# Patient Record
Sex: Male | Born: 1990 | Race: Black or African American | Hispanic: No | Marital: Single | Smoking: Never smoker
Health system: Southern US, Community
[De-identification: ages and names within clinical notes are randomized; demographics above are authoritative.]

## PROBLEM LIST (undated history)

## (undated) DIAGNOSIS — J302 Other seasonal allergic rhinitis: Secondary | ICD-10-CM

---
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
## 2011-05-01 ENCOUNTER — Encounter (HOSPITAL_COMMUNITY): Payer: Self-pay

## 2011-05-01 ENCOUNTER — Emergency Department (INDEPENDENT_AMBULATORY_CARE_PROVIDER_SITE_OTHER)
Admission: EM | Admit: 2011-05-01 | Discharge: 2011-05-01 | Disposition: A | Discharge status: Home / Self Care | Payer: Medicaid Other | Source: Home / Self Care | Attending: Emergency Medicine | Admitting: Emergency Medicine

## 2011-05-01 DIAGNOSIS — B085 Enteroviral vesicular pharyngitis: Secondary | ICD-10-CM

## 2011-05-01 HISTORY — DX: Other seasonal allergic rhinitis: J30.2

## 2011-05-01 LAB — POCT RAPID STREP A: Streptococcus, Group A Screen (Direct): NEGATIVE

## 2011-05-01 MED ORDER — SUCRALFATE 1 GM/10ML PO SUSP: 1.0000 g | Freq: Four times a day (QID) | ORAL | Status: AC

## 2011-05-01 NOTE — ED Provider Notes (Signed)
Chief Complaint  Patient presents with  . Sore Throat    History of Present Illness:  Logan Rivera is a 21 year old male who has had a three-day history of sore throat, muscle aches, chills, dry cough, nausea, and upper abdominal pain. He denies any headache, nasal congestion, rhinorrhea, swollen glands, vomiting, or diarrhea. He has not been exposed to strep or to mono.  Review of Systems:  Other than noted above, the patient denies any of the following symptoms. Systemic:  No fever, chills, sweats, fatigue, myalgias, headache, or anorexia. Eye:  No redness, pain or drainage. ENT:  No earache, nasal congestion, rhinorrhea, sinus pressure, or sore throat. Lungs:  No cough, sputum production, wheezing, shortness of breath. Or chest pain. GI:  No nausea, vomiting, abdominal pain or diarrhea. Skin:  No rash or itching.  PMFSH:  Past medical history, family history, social history, meds, and allergies were reviewed.  Physical Exam:   Vital signs:  BP 113/63  Pulse 72  Temp(Src) 99 F (37.2 C) (Oral)  Resp 14  SpO2 100% General:  Alert, in no distress. Eye:  No conjunctival injection or drainage. ENT:  TMs and canals were normal, without erythema or inflammation.  Nasal mucosa was clear and uncongested, without drainage.  Mucous membranes were moist.  Exam of the pharynx reveals a shallow ulceration on his left soft palate. The tonsils were not enlarged and there was no exudate.  There were no oral ulcerations or lesions. Neck:  Supple, no adenopathy, tenderness or mass. Lungs:  No respiratory distress.  Lungs were clear to auscultation, without wheezes, rales or rhonchi.  Breath sounds were clear and equal bilaterally. Heart:  Regular rhythm, without gallops, murmers or rubs. Skin:  Clear, warm, and dry, without rash or lesions.  Labs:   Results for orders placed during the hospital encounter of 05/01/11  POCT RAPID STREP A ([facility] ONLY)      Component Value Range   Streptococcus,  Group A Screen (Direct) NEGATIVE  NEGATIVE      Radiology:  No results found.  Medications given in UCC:  None  Assessment:   Diagnoses that have been ruled out:  None  Diagnoses that are still under consideration:  None  Final diagnoses:  Herpangina      Plan:   1.  The following meds were prescribed:   New Prescriptions   SUCRALFATE (CARAFATE) 1 GM/10ML SUSPENSION    Take 10 mLs (1 g total) by mouth 4 (four) times daily.   2.  The patient was instructed in symptomatic care and handouts were given. 3.  The patient was told to return if becoming worse in any way, if no better in 3 or 4 days, and given some red flag symptoms that would indicate earlier return.   Roque Lias, MD 05/01/11 [phone]

## 2011-05-01 NOTE — ED Notes (Signed)
Provided ice chips, gingerale, crackers

## 2011-05-01 NOTE — ED Notes (Signed)
C/o sore throat and chills. Pt states unsure when sx started.  Mother states this am he was c/o sore throat, neck pain and chills.  Denies cold sx

## 2013-06-12 ENCOUNTER — Emergency Department (HOSPITAL_COMMUNITY): Payer: Worker's Compensation

## 2013-06-12 ENCOUNTER — Encounter (HOSPITAL_COMMUNITY): Payer: Self-pay | Admitting: Emergency Medicine

## 2013-06-12 ENCOUNTER — Emergency Department (HOSPITAL_COMMUNITY)
Admission: EM | Admit: 2013-06-12 | Discharge: 2013-06-12 | Disposition: A | Discharge status: Home / Self Care | Payer: Worker's Compensation | Attending: Emergency Medicine | Admitting: Emergency Medicine

## 2013-06-12 DIAGNOSIS — G8929 Other chronic pain: Secondary | ICD-10-CM | POA: Insufficient documentation

## 2013-06-12 DIAGNOSIS — Z8709 Personal history of other diseases of the respiratory system: Secondary | ICD-10-CM | POA: Insufficient documentation

## 2013-06-12 DIAGNOSIS — M549 Dorsalgia, unspecified: Secondary | ICD-10-CM

## 2013-06-12 DIAGNOSIS — M546 Pain in thoracic spine: Secondary | ICD-10-CM | POA: Insufficient documentation

## 2013-06-12 DIAGNOSIS — F79 Unspecified intellectual disabilities: Secondary | ICD-10-CM | POA: Insufficient documentation

## 2013-06-12 DIAGNOSIS — IMO0002 Reserved for concepts with insufficient information to code with codable children: Secondary | ICD-10-CM | POA: Insufficient documentation

## 2013-06-12 LAB — URINALYSIS, ROUTINE W REFLEX MICROSCOPIC
Bilirubin Urine: NEGATIVE
Glucose, UA: NEGATIVE mg/dL
Hgb urine dipstick: NEGATIVE
Ketones, ur: 15 mg/dL — AB
Leukocytes, UA: NEGATIVE
NITRITE: NEGATIVE
PH: 7.5 (ref 5.0–8.0)
Protein, ur: NEGATIVE mg/dL
Specific Gravity, Urine: 1.023 (ref 1.005–1.030)
Urobilinogen, UA: 1 mg/dL (ref 0.0–1.0)

## 2013-06-12 MED ORDER — KETOROLAC TROMETHAMINE 15 MG/ML IJ SOLN
30.0000 mg | Freq: Once | INTRAMUSCULAR | Status: AC
  Administered 2013-06-12: 30 mg via INTRAMUSCULAR
  Filled 2013-06-12: qty 2

## 2013-06-12 MED ORDER — NAPROXEN 500 MG PO TABS: 500.0000 mg | ORAL_TABLET | Freq: Two times a day (BID) | ORAL | Status: AC

## 2013-06-12 MED ORDER — CYCLOBENZAPRINE HCL 5 MG PO TABS: 5.0000 mg | ORAL_TABLET | Freq: Three times a day (TID) | ORAL | Status: AC | PRN

## 2013-06-12 NOTE — ED Notes (Signed)
Per pt, states he was lifting something at work-started having lower back pain radiating to left side-states discomfort with inhalation and exhalation-no s/s's of respiratory distress

## 2013-06-12 NOTE — Progress Notes (Signed)
[facility] Stacy, provided pt with a list of primary care resources to help patient establish primary care.  °

## 2013-06-12 NOTE — ED Provider Notes (Signed)
CSN: [id number]     Arrival date & time 06/12/13  1435 History  This chart was scribed for non-physician practitioner, Coral CeoJessica Julissa Browning, PA-C working with Celene KrasJon R Knapp, MD by Greggory StallionKayla Andersen, ED scribe. This patient was seen in room WTR9/WTR9 and the patient's care was started at 3:46 PM.    Chief Complaint  Patient presents with  . Back Pain   The history is provided by the patient. No language interpreter was used.   HPI Comments: Logan Rivera is a 23 y.o. male with a PMH of asthma and seasonal allergies who presents to the Emergency Department complaining of worsening left upper back pain that started 2 days ago after lifting a 30 pound object. History provided by mother and patient. Patient is a poor historian. 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 Denies fall/blunt trauma. Patient has a hx of chronic lower back pain and muscle spasms. He states his pain today is similar to his intermittent spasms in the past but is in a different location. Pain is worsened with exhaling and movement. He has used a back brace with no relief. Denies fever, difficulty breathing, cough, SOB, chest pain, abdominal pain, bowel or bladder incontinence, dysuria. No hx of DVT/PE/clotting, hx of cancer, or recent surgery/immobilization/travel.     Past Medical History  Diagnosis Date  . Seasonal allergies    History reviewed. No pertinent past surgical history. No family history on file. History  Substance Use Topics  . Smoking status: Never Smoker   . Smokeless tobacco: Not on file  . Alcohol Use: No    Review of Systems  Constitutional: Negative for fever.  Respiratory: Negative for cough and shortness of breath.   Cardiovascular: Negative for chest pain.  Gastrointestinal: Negative for abdominal pain.  Genitourinary: Negative for dysuria.       Negative for bowel or bladder incontinence.   Musculoskeletal: Positive for back pain.  All other systems reviewed and are negative.   Allergies  Review of patient's allergies indicates no known allergies.  Home Medications   Current Outpatient Rx  Name  Route  Sig  Dispense  Refill  . Cetirizine HCl (ZYRTEC PO)   Oral   Take by mouth.         . fluticasone (VERAMYST) 27.5 MCG/SPRAY nasal spray   Nasal   Place 2 sprays into the nose daily.         . mometasone (ASMANEX) 220 MCG/INH inhaler   Inhalation   Inhale 2 puffs into the lungs daily.          BP 138/79  Pulse 63  Temp(Src) 98.3 F (36.8 C) (Oral)  Resp 16  SpO2 100%  Filed Vitals:   06/12/13 1445 06/12/13 1720  BP: 138/79 124/60  Pulse: 63 60  Temp: 98.3 F (36.8 C)   TempSrc: Oral   Resp: 16 16  SpO2: 100% 100%    Physical Exam  Nursing note and vitals reviewed. Constitutional: He is oriented to person, place, and time. He appears well-developed and well-nourished. No distress.  HENT:  Head: Normocephalic and atraumatic.  Right Ear: External ear normal.  Left Ear: External ear normal.  Mouth/Throat: Oropharynx is clear and moist.  Eyes: Conjunctivae and EOM are normal. Right eye exhibits no discharge. Left eye exhibits no discharge.  Neck: Normal range of motion. Neck supple. No tracheal deviation present.  No cervical spinal or  paraspinal tenderness to palpation throughout.  No limitations with neck ROM.    Cardiovascular: Normal rate, regular rhythm, normal heart sounds and intact distal pulses.  Exam reveals no gallop and no friction rub.   No murmur heard. Dorsalis pedis pulses present and equal bilaterally  Pulmonary/Chest: Effort normal and breath sounds normal. No respiratory distress. He has no wheezes. He has no rales.   He exhibits tenderness.    Tenderness to palpation to the left lower lateral and posterior ribs diffusely. No underlying crepitus or palpable step-off. No edema, ecchymosis, erythema, rash, or lacerations.    Abdominal: Soft. He exhibits no distension. There is no tenderness.  Left mild CVA tenderness  Musculoskeletal: Normal range of motion. He exhibits no edema and no tenderness.  No tenderness to palpation to the thoracic or lumbar spinous processes throughout.  No tenderness to palpation to the paraspinal muscles throughout. Strength 5/5 in the upper and lower extremities bilaterally. Patient able to ambulate without difficulty or ataxia.    Neurological: He is alert and oriented to person, place, and time.  Patellar reflexes intact bilaterally  Skin: Skin is warm and dry. He is not diaphoretic.  Psychiatric: He has a normal mood and affect. His behavior is normal.    ED Course  Procedures (including critical care time)  DIAGNOSTIC STUDIES: Oxygen Saturation is 100% on RA, normal by my interpretation.    COORDINATION OF CARE: 3:51 PM-Discussed treatment plan which includes xray and Toradol with pt at bedside and pt agreed to plan. Family  Labs Review Labs Reviewed - No data to display Imaging Review No results found.  EKG Interpretation  None  Results for orders placed during the hospital encounter of 06/12/13  URINALYSIS, ROUTINE W REFLEX MICROSCOPIC      Result Value Ref Range   Color, Urine YELLOW  YELLOW   APPearance CLEAR  CLEAR   Specific Gravity, Urine 1.023  1.005 - 1.030   pH 7.5  5.0 - 8.0   Glucose, UA NEGATIVE  NEGATIVE mg/dL   Hgb urine dipstick NEGATIVE  NEGATIVE   Bilirubin Urine NEGATIVE  NEGATIVE   Ketones, ur 15 (*) NEGATIVE mg/dL   Protein, ur NEGATIVE  NEGATIVE mg/dL   Urobilinogen, UA 1.0  0.0 - 1.0 mg/dL   Nitrite NEGATIVE  NEGATIVE   Leukocytes, UA NEGATIVE  NEGATIVE     DG Chest 2 View (Final result)  Result time: 06/12/13 16:33:32    Final result by Rad Results In Interface (06/12/13 16:33:32)    Narrative:   CLINICAL DATA: Upper back pain with dyspnea, history of asthma  EXAM: CHEST 2 VIEW  COMPARISON: DG CHEST 2 VIEW dated  12/03/2008  FINDINGS: The lungs are mildly hyperinflated without hemidiaphragm flattening. There is no focal infiltrate. Stable nodules measuring up to 3 mm in diameter are present bilaterally consistent with previous granulomatous infection. The cardiopericardial silhouette is normal in size. The pulmonary vascularity is not engorged. The mediastinum is normal in width. There is no pleural effusion or pneumothorax or pneumomediastinum. The observed portions of the bony thorax exhibit no acute abnormalities.  IMPRESSION: There is mild hyperinflation which may reflect the patient's reactive airway disease. There is no evidence of acute pneumonia nor CHF.   Electronically Signed By: David [place] On: 06/12/2013 16:33          MDM   Logan Rivera is a 23 y.o. male is a 23 y.o. male with a PMH of asthma and seasonal allergies who presents to the Emergency  Department complaining of worsening upper back pain that started 2 days ago after lifting a 30 pound object.   Rechecks  5:00 PM = Patient states he feels much better. No distress.    Etiology of back pain likely due to a muscle strain. X-rays negative for and acute cardiopulmonary process or fx. Patient's x-rays showed RAD and nodules which was communicated to the patient. No complaints of SOB, dyspnea, or other respiratory concerns. No evidence of a UTI on UA. Vital signs stable with no hypoxia or tachypnea. Patient neurovascularly intact with no neurological deficits. Patient instructed to follow-up with PCP. Return precautions, discharge instructions, and follow-up was discussed with the patient before discharge.       Discharge Medication List as of 06/12/2013  5:07 PM    START taking these medications   Details  cyclobenzaprine (FLEXERIL) 5 MG tablet Take 1 tablet (5 mg total) by mouth 3 (three) times daily as needed for muscle spasms., Starting 06/12/2013, Until Discontinued, Print    naproxen (NAPROSYN) 500 MG tablet  Take 1 tablet (500 mg total) by mouth 2 (two) times daily with a meal., Starting 06/12/2013, Until Discontinued, Print         Final impressions: 1. Back pain       Thomasenia Sales   I personally performed the services described in this documentation, which was scribed in my presence. The recorded information has been reviewed and is accurate.        Jillyn Ledger, PA-C 06/13/13 1131

## 2013-06-12 NOTE — Discharge Instructions (Signed)
Take flexeril for muscle spasm - take this medication at night - Please be careful with this medication.  It can cause drowsiness.  Use caution while driving, operating machinery, drinking alcohol, or any other activities that may impair your physical or mental abilities.   Take naprosyn twice daily with food for pain  Your x-ray showed lung nodules - please follow-up with your doctor regarding this Return to the emergency department if you develop any changing/worsening condition, fever, difficulty breathing, loss of sensation, weakness, loss of bowel/bladder function, repeated vomiting, chest pain, or any other concerns (please read additional information regarding your condition below)     Back Pain, Adult Low back pain is very common. About 1 in 5 people have back pain.The cause of low back pain is rarely dangerous. The pain often gets better over time.About half of people with a sudden onset of back pain feel better in just 2 weeks. About 8 in 10 people feel better by 6 weeks.  CAUSES Some common causes of back pain include:  Strain of the muscles or ligaments supporting the spine.  Wear and tear (degeneration) of the spinal discs.  Arthritis.  Direct injury to the back. DIAGNOSIS Most of the time, the direct cause of low back pain is not known.However, back pain can be treated effectively even when the exact cause of the pain is unknown.Answering your caregiver's questions about your overall health and symptoms is one of the most accurate ways to make sure the cause of your pain is not dangerous. If your caregiver needs more information, he or she may order lab work or imaging tests (X-rays or MRIs).However, even if imaging tests show changes in your back, this usually does not require surgery. HOME CARE INSTRUCTIONS For many people, back pain returns.Since low back pain is rarely dangerous, it is often a condition that people can learn to [facility] their own.   Remain active. It  is stressful on the back to sit or stand in one place. Do not sit, drive, or stand in one place for more than 30 minutes at a time. Take short walks on level surfaces as soon as pain allows.Try to increase the length of time you walk each day.  Do not stay in bed.Resting more than 1 or 2 days can delay your recovery.  Do not avoid exercise or work.Your body is made to move.It is not dangerous to be active, even though your back may hurt.Your back will likely heal faster if you return to being active before your pain is gone.  Pay attention to your body when you bend and lift. Many people have less discomfortwhen lifting if they bend their knees, keep the load close to their bodies,and avoid twisting. Often, the most comfortable positions are those that put less stress on your recovering back.  Find a comfortable position to sleep. Use a firm mattress and lie on your side with your knees slightly bent. If you lie on your back, put a pillow under your knees.  Only take over-the-counter or prescription medicines as directed by your caregiver. Over-the-counter medicines to reduce pain and inflammation are often the most helpful.Your caregiver may prescribe muscle relaxant drugs.These medicines help dull your pain so you can more quickly return to your normal activities and healthy exercise.  Put ice on the injured area.  Put ice in a plastic bag.  Place a towel between your skin and the bag.  Leave the ice on for 15-20 minutes, 03-04 times a day for  the first 2 to 3 days. After that, ice and heat may be alternated to reduce pain and spasms.  Ask your caregiver about trying back exercises and gentle massage. This may be of some benefit.  Avoid feeling anxious or stressed.Stress increases muscle tension and can worsen back pain.It is important to recognize when you are anxious or stressed and learn ways to manage it.Exercise is a great option. SEEK MEDICAL CARE IF:  You have pain that  is not relieved with rest or medicine.  You have pain that does not improve in 1 week.  You have new symptoms.  You are generally not feeling well. SEEK IMMEDIATE MEDICAL CARE IF:   You have pain that radiates from your back into your legs.  You develop new bowel or bladder control problems.  You have unusual weakness or numbness in your arms or legs.  You develop nausea or vomiting.  You develop abdominal pain.  You feel faint. Document Released: 04/02/2005 Document Revised: 10/02/2011 Document Reviewed: 08/21/2010 [facility] Patient Information 2014 [place], [place].   Emergency Department Resource Guide 1) Find a Doctor and Pay Out of Pocket Although you won't have to find out who is covered by your insurance plan, it is a good idea to ask around and get recommendations. You will then need to call the office and see if the doctor you have chosen will accept you as a new patient and what types of options they offer for patients who are self-pay. Some doctors offer discounts or will set up payment plans for their patients who do not have insurance, but you will need to ask so you aren't surprised when you get to your appointment.  2) Contact Your Local Health Department Not all health departments have doctors that can see patients for sick visits, but many do, so it is worth a call to see if yours does. If you don't know where your local health department is, you can check in your phone book. The CDC also has a tool to help you locate your state's health department, and many state websites also have listings of all of their local health departments.  3) Find a Walk-in Clinic If your illness is not likely to be very severe or complicated, you may want to try a walk in clinic. These are popping up all over the country in pharmacies, drugstores, and shopping centers. They're usually staffed by nurse practitioners or physician assistants that have been trained to treat common illnesses and  complaints. They're usually fairly quick and inexpensive. However, if you have serious medical issues or chronic medical problems, these are probably not your best option.  No Primary Care Doctor: - Call Health Connect at  [phone] - they can help you locate a primary care doctor that  accepts your insurance, provides certain services, etc. - Physician Referral Service- [phone]  Chronic Pain Problems: Organization         Address  Phone   Notes  [facility]  [phone] Patients need to be referred by their primary care doctor.   Medication Assistance: Organization         Address  Phone   Notes  [facility] Medication [facility] [address] [place] [place], [place] [address] [phone] --Must be a resident of [facility] -- Must have NO insurance coverage whatsoever (no Medicaid/ Medicare, etc.) -- The pt. MUST have a primary care doctor that directs their care regularly and follows them in the community  MedAssist  [phone]) [phone]   [employer]  [phone]) [phone]    Agencies that provide inexpensive medical care: Organization         Address  Phone   Notes  [facility]  [phone]   [facility]    [phone]) [phone]   [facility] [address] [place], [place] [address] [phone]) [phone]   [facility] [address], [place] [phone]   [facility]anned [facility]    [phone]   [facility]    [phone](336) [phone]   [facility]  [address], [place] Phone:  [phone](336) [phone], Fax:  [phone] Hours of Operation:  9 am - 6 pm, M-F.  Also accepts Medicaid/Medicare and self-pay.  [facility]  [address], [place] Phone: [phone](336) [phone], Fax: [phone]. Hours of Operation:  8:30 am - 5:30 pm, M-F.  Also accepts Medicaid and self-pay.  [facility] [place] [address], [place] Phone: [phone]) [phone]   [facility] [address], [place], [place] [phone], Ext. [phone] Mondays & Thursdays: 7-9 AM.  First 15 patients are seen on a first come, first serve basis.    Medicaid-accepting [facility] Providers:  Organization         Address  Phone   Notes  [facility] [address], [place] [phone] Also accepts self-pay patients.  [facility] [address], [place]  [phone]) [phone]   [facility] [address], [place] [phone]) [phone]   [facility] [address] [place] [address], [place] [phone]) [phone]   Renaye RakersVeita Bland [address], [place]   [phone] Only accepts [place] Access [place] patients after they have their name applied to their card.   Self-Pay (no insurance) in [facility]:  Organization         Address  Phone   Notes  Sickle Cell Patients, [facility] [address], [place] [phone]) [phone]   [facility] [address], [place] [phone]   [facility] [place]  [address], [place] [phone]   [facility]/Dr. Osei-Bonsu  [address] [place] [address], [place] or [id number], [place] [phone] Phone number for both [place] and [place] locations is the same.  [facility] [address] [address], [place] [phone]) [phone]   [facility] [address] [place] [address], [place] or [address] [phone]) [phone]   [facility] [address], [place] [phone]) [phone], phone; [phone](336) [phone], fax Sees patients 1st and 3rd Saturday of every month.  Must not qualify for public or private insurance (i.e. Medicaid, Medicare, [place] Health Choice, Veterans' Benefits)  Household income should be no more than 200% of the poverty level  The clinic cannot treat you if you are pregnant or think you are pregnant  Sexually transmitted diseases are not treated at the clinic.    Dental Care: Organization         Address  Phone  Notes  [facility] [address], [place] [phone] Accepts children up to age 22 who are enrolled in [place]icaid or [place] Health Choice;  pregnant women with a Medicaid card; and children who have applied for Medicaid or [place] Health Choice, but were declined, whose parents can pay a reduced fee at time of service.  [facility] of [facility] [place]  [address], [place] [phone]) [phone] Accepts children up to age 22 who are enrolled in [place] or [place] Health Choice; pregnant women with a Medicaid card; and children who have applied for Medicaid or [place] Health Choice, but were declined, whose parents can pay a reduced fee at time of service.  [facility]  [address], [place] [phone]) [phone] Patients are seen by appointment only. Walk-ins are not accepted. [facility] will see patients 22 years of age and older. Monday - Tuesday (8am-5pm) Most Wednesdays (8:30-5pm) $30 per visit, cash only  [facility]  [address], [facility] [phone]) [phone] Patients are seen by appointment only. Walk-ins are not accepted. [facility] will see patients 22 years of age and older. One Wednesday Evening (Monthly: Volunteer Based).  $30 per visit, cash only  [employer] [facility]  [phone] for adults; Children under age 644, call Graduate Pediatric Dentistry at [phone]) [phone]. Children aged 494-14, please call [phone] to request a pediatric application.  Dental services are provided in all areas of dental care including fillings, crowns and bridges, complete and partial dentures, implants, gum treatment, root canals, and extractions. Preventive care is  also provided. Treatment is provided to both adults and children. Patients are selected via a lottery and there is often a waiting list.   [facility] [address], [place]  [phone] [email or web address]   [facility] [address], [place], [place] [phone], Ext. [phone] Second and Fourth Thursday of each month, opens at 6:30 AM; Clinic ends at 9 AM.  Patients are seen on a first-come first-served basis, and a limited number are seen during each clinic.   [facility]  [address], [place], [place] [phone]) [phone]   Eligibility Requirements You must have lived in [place], [place], or [place] counties for at least the last three months.   You cannot be eligible for state or federal sponsored National Cityhealthcare insurance, including CIGNAVeterans Administration, [place]icaid, or [employer]Medicare.   You generally cannot be eligible for healthcare insurance through your employer.    How to apply: Eligibility screenings are held every Tuesday and Wednesday afternoon from 1:00 pm until 4:00 pm. You do not need an appointment for the interview!  [facility] [address], [place], [place] [phone]   [facility]  [phone]   [facility]  [phone]   [facility]  [phone]    Behavioral Health Resources in the Community: Intensive Outpatient Programs Organization         Address  Phone  Notes  [facility] [address], [place], [place] [phone]   [facility] [address], [place], [place] [phone]   [facility] [address], [place], [place]  [phone]   [facility] [address],  [place], [place] [phone] or [phone]   Substance Abuse Resources Organization         Address  Phone  Notes  [facility] Drug Services  [phone]   [facility]  [phone]   [facility]  [phone]  Floydene Flock  [phone]   Residential & Outpatient Substance Abuse Program  [phone]   Psychological Services Organization         Address  Phone  Notes  [facility] Health  [phone]   [facility] Services  [phone]   [facility] Mental Health [address] [place] [phone] or [phone]    Mobile Crisis Teams Organization         Address  Phone  Notes  [facility], Mobile Crisis Care Unit  [phone]   [facility]  [address] [place], [place] [phone]   Doristine Locks [address] [place] [phone]    Self-Help/Support Groups Organization         Address  Phone             Notes  Mental Health Assoc. of  - variety of support groups  336- I7437963 Call for more information  Narcotics Anonymous (NA), Caring Services [address], [employer] [place]  2 meetings at this location   Statistician         Address  Phone  Notes  [facility] [address],    [place]  [phone]   [facility]  [address] [place] [address], [place], [place] [phone]   [facility] [address] [place], [place] [phone] Admissions: 8am-3pm M-F  [facility] [address],    [place], [place] [phone]   [facility] [address] [place], [place], [place] [phone]   [facility] [address],  [place], [place] [phone]   [facility] - Intensive Outpatient [address], [place], [place] [phone]   [facility] [address] [place].,  [place], [place] [phone] or [phone]   [facility] ([facility]) [address] [place], [place] [phone] Accepts Medicaid  [facility] [address],  [place] [phone]  Substance Abuse/Addiction Treatment   [facility] Organization         Address  Phone  Notes  [facility]  [phone]   Angie Fava, PhD [address] [place], [place]   [phone] or [phone]   [facility] Behavioral   [address] [place], [place] [phone]   [facility] [address] [place], [place] [phone] Insurance/Medicaid/sponsorship through [facility] Families [address]                                    [place], [place] [phone] Therapy/tele-psych/case  [facility] [address] [place], [place] [phone]    Dr. Lolly Mustache  [phone]   [facility]  [employer] [facility] Dept. 1) [address] [place] 2) [address] [place] 3)  [address], [place] [phone]  [phone]   [facility] Child Abuse Hotline 440-118-7112 or [phone] (After Hours)

## 2013-06-13 NOTE — ED Provider Notes (Signed)
Medical screening examination/treatment/procedure(s) were performed by non-physician practitioner and as supervising physician I was immediately available for consultation/collaboration.    Ashaya Raftery R Farron Watrous, MD 06/13/13 2258 

## 2013-06-15 ENCOUNTER — Encounter (HOSPITAL_COMMUNITY): Payer: Self-pay | Admitting: Emergency Medicine

## 2013-06-15 ENCOUNTER — Emergency Department (HOSPITAL_COMMUNITY)
Admission: EM | Admit: 2013-06-15 | Discharge: 2013-06-15 | Disposition: A | Discharge status: Home / Self Care | Payer: Worker's Compensation | Attending: Emergency Medicine | Admitting: Emergency Medicine

## 2013-06-15 DIAGNOSIS — IMO0002 Reserved for concepts with insufficient information to code with codable children: Secondary | ICD-10-CM | POA: Insufficient documentation

## 2013-06-15 DIAGNOSIS — Z791 Long term (current) use of non-steroidal anti-inflammatories (NSAID): Secondary | ICD-10-CM | POA: Insufficient documentation

## 2013-06-15 DIAGNOSIS — S3981XA Other specified injuries of abdomen, initial encounter: Secondary | ICD-10-CM | POA: Insufficient documentation

## 2013-06-15 DIAGNOSIS — Y9389 Activity, other specified: Secondary | ICD-10-CM | POA: Insufficient documentation

## 2013-06-15 DIAGNOSIS — S29011A Strain of muscle and tendon of front wall of thorax, initial encounter: Secondary | ICD-10-CM

## 2013-06-15 DIAGNOSIS — Y929 Unspecified place or not applicable: Secondary | ICD-10-CM | POA: Insufficient documentation

## 2013-06-15 DIAGNOSIS — X500XXA Overexertion from strenuous movement or load, initial encounter: Secondary | ICD-10-CM | POA: Insufficient documentation

## 2013-06-15 MED ORDER — DICLOFENAC SODIUM 1 % TD GEL: 2.0000 g | Freq: Four times a day (QID) | TRANSDERMAL | Status: AC

## 2013-06-15 MED ORDER — IBUPROFEN 100 MG/5ML PO SUSP: 400.0000 mg | ORAL | Status: DC | PRN

## 2013-06-15 MED ORDER — DICLOFENAC SODIUM 1 % TD GEL: 2.0000 g | Freq: Four times a day (QID) | TRANSDERMAL | Status: DC

## 2013-06-15 MED ORDER — IBUPROFEN 100 MG/5ML PO SUSP: 400.0000 mg | ORAL | Status: AC | PRN

## 2013-06-15 NOTE — Progress Notes (Signed)
[facility] CL provided pt with a list of primary care resources to help patient establish primary care.  °

## 2013-06-15 NOTE — Discharge Instructions (Signed)

## 2013-06-15 NOTE — ED Notes (Signed)
Pt reports back pain, sts was here on Friday was prescribed pain medication and has no relief.

## 2013-06-15 NOTE — ED Provider Notes (Signed)
CSN: [id number]     Arrival date & time 06/15/13  0904 History   None    Chief Complaint  Patient presents with  . Back Pain     (Consider location/radiation/quality/duration/timing/severity/associated sxs/prior Treatment) HPI  Patient to the ER with complaints of abdominal pains, He has MR and is brought in by his mother says he has been cranking and snapping at them when they try to help him get out the bed. He works as a Nature conservation officer at [employer] and the job is so physical it makes his pain worse. The pain is in the right mid back. He does not have pain unless he twists to the right. The pain gets better with movement and is worse in the morning. She denies him having any urinary or bowel incontinence. No CP, SOB, wheezing, coughing, fever, nausea, vomiting or diarrhea.  Patient seen earlier for the same and written for Flexeril and Naprosyn. She reports the Naprosyn makes him very sleepy.  Past Medical History  Diagnosis Date  . Seasonal allergies    History reviewed. No pertinent past surgical history. History reviewed. No pertinent family history. History  Substance Use Topics  . Smoking status: Never Smoker   . Smokeless tobacco: Not on file  . Alcohol Use: No    Review of Systems The patient denies anorexia, fever, weight loss,, vision loss, decreased hearing, hoarseness, chest pain, syncope, dyspnea on exertion, peripheral edema, balance deficits, hemoptysis, abdominal pain, melena, hematochezia, severe indigestion/heartburn, hematuria, incontinence, genital sores, muscle weakness, suspicious skin lesions, transient blindness, difficulty walking, depression, unusual weight change, abnormal bleeding, enlarged lymph nodes, angioedema, and breast masses.    Allergies  Review of patient's allergies indicates no known allergies.  Home Medications   Current Outpatient Rx  Name  Route  Sig  Dispense  Refill  . cyclobenzaprine (FLEXERIL) 5 MG tablet   Oral   Take 1 tablet (5 mg  total) by mouth 3 (three) times daily as needed for muscle spasms.   15 tablet   0   . naproxen (NAPROSYN) 500 MG tablet   Oral   Take 1 tablet (500 mg total) by mouth 2 (two) times daily with a meal.   30 tablet   0   . diclofenac sodium (VOLTAREN) 1 % GEL   Topical   Apply 2 g topically 4 (four) times daily.   100 g   0   . ibuprofen (CHILDRENS MOTRIN) 100 MG/5ML suspension   Oral   Take 20 mLs (400 mg total) by mouth every 4 (four) hours as needed.   500 mL   0    BP 114/56  Pulse 63  Temp(Src) 97.9 F (36.6 C) (Oral)  Resp 18  SpO2 100% Physical Exam  Nursing note and vitals reviewed. Constitutional: He appears well-developed and well-nourished. No distress.  HENT:  Head: Normocephalic and atraumatic.  Eyes: Pupils are equal, round, and reactive to light.  Neck: Normal range of motion. Neck supple.  Cardiovascular: Normal rate and regular rhythm.   Pulmonary/Chest: Effort normal.  Abdominal: Soft.  Musculoskeletal:       Back:   Equal strength to bilateral lower extremities. Neurosensory function adequate to both legs. Skin color is normal. Skin is warm and moist. I see no step off deformity, no bony tenderness. Pt is able to ambulate without limp. Pain is relieved when sitting in certain positions. ROM is decreased due to pain. No crepitus, laceration, effusion, swelling.  Pulses are normal   Neurological: He is alert.  Skin: Skin is warm and dry.    ED Course  Procedures (including critical care time) Labs Review Labs Reviewed - No data to display Imaging Review No results found.   EKG Interpretation None      MDM   Final diagnoses:  Muscle strain of chest wall   Unable to tolerate Motrin, Tylenol, Naprosyn or Flexeril because he either doesn't like it or it makes him sleepy. The MOtrin pill he has been crushing tastes bad. Will Rx for solution Motrin and Rx topical Voltaren.   F/u with ortho.  22 y.o.Paarth E Poust's evaluation in the  Emergency Department is complete. It has been determined that no acute conditions requiring further emergency intervention are present at this time. The patient/guardian have been advised of the diagnosis and plan. We have discussed signs and symptoms that warrant return to the ED, such as changes or worsening in symptoms.  Vital signs are stable at discharge. Filed Vitals:   06/15/13 1055  BP: 114/56  Pulse: 63  Temp:   Resp: 18    Patient/guardian has voiced understanding and agreed to follow-up with the PCP or specialist.     Dorthula Matasiffany G Daniela Siebers, PA-C 06/16/13 1918

## 2013-06-16 NOTE — ED Provider Notes (Signed)
Medical screening examination/treatment/procedure(s) were performed by non-physician practitioner and as supervising physician I was immediately available for consultation/collaboration.  Eula Jaster L Lucca Greggs, MD 06/16/13 2039 

## 2015-04-01 IMAGING — CR DG CHEST 2V
2 series · 2 of 2 positions shown · non-contrast
Comparison: DG CHEST 2 VIEW dated 12/03/2008

CLINICAL DATA: Upper back pain with dyspnea, history of asthma

EXAM:
CHEST  2 VIEW

[w chest pa]
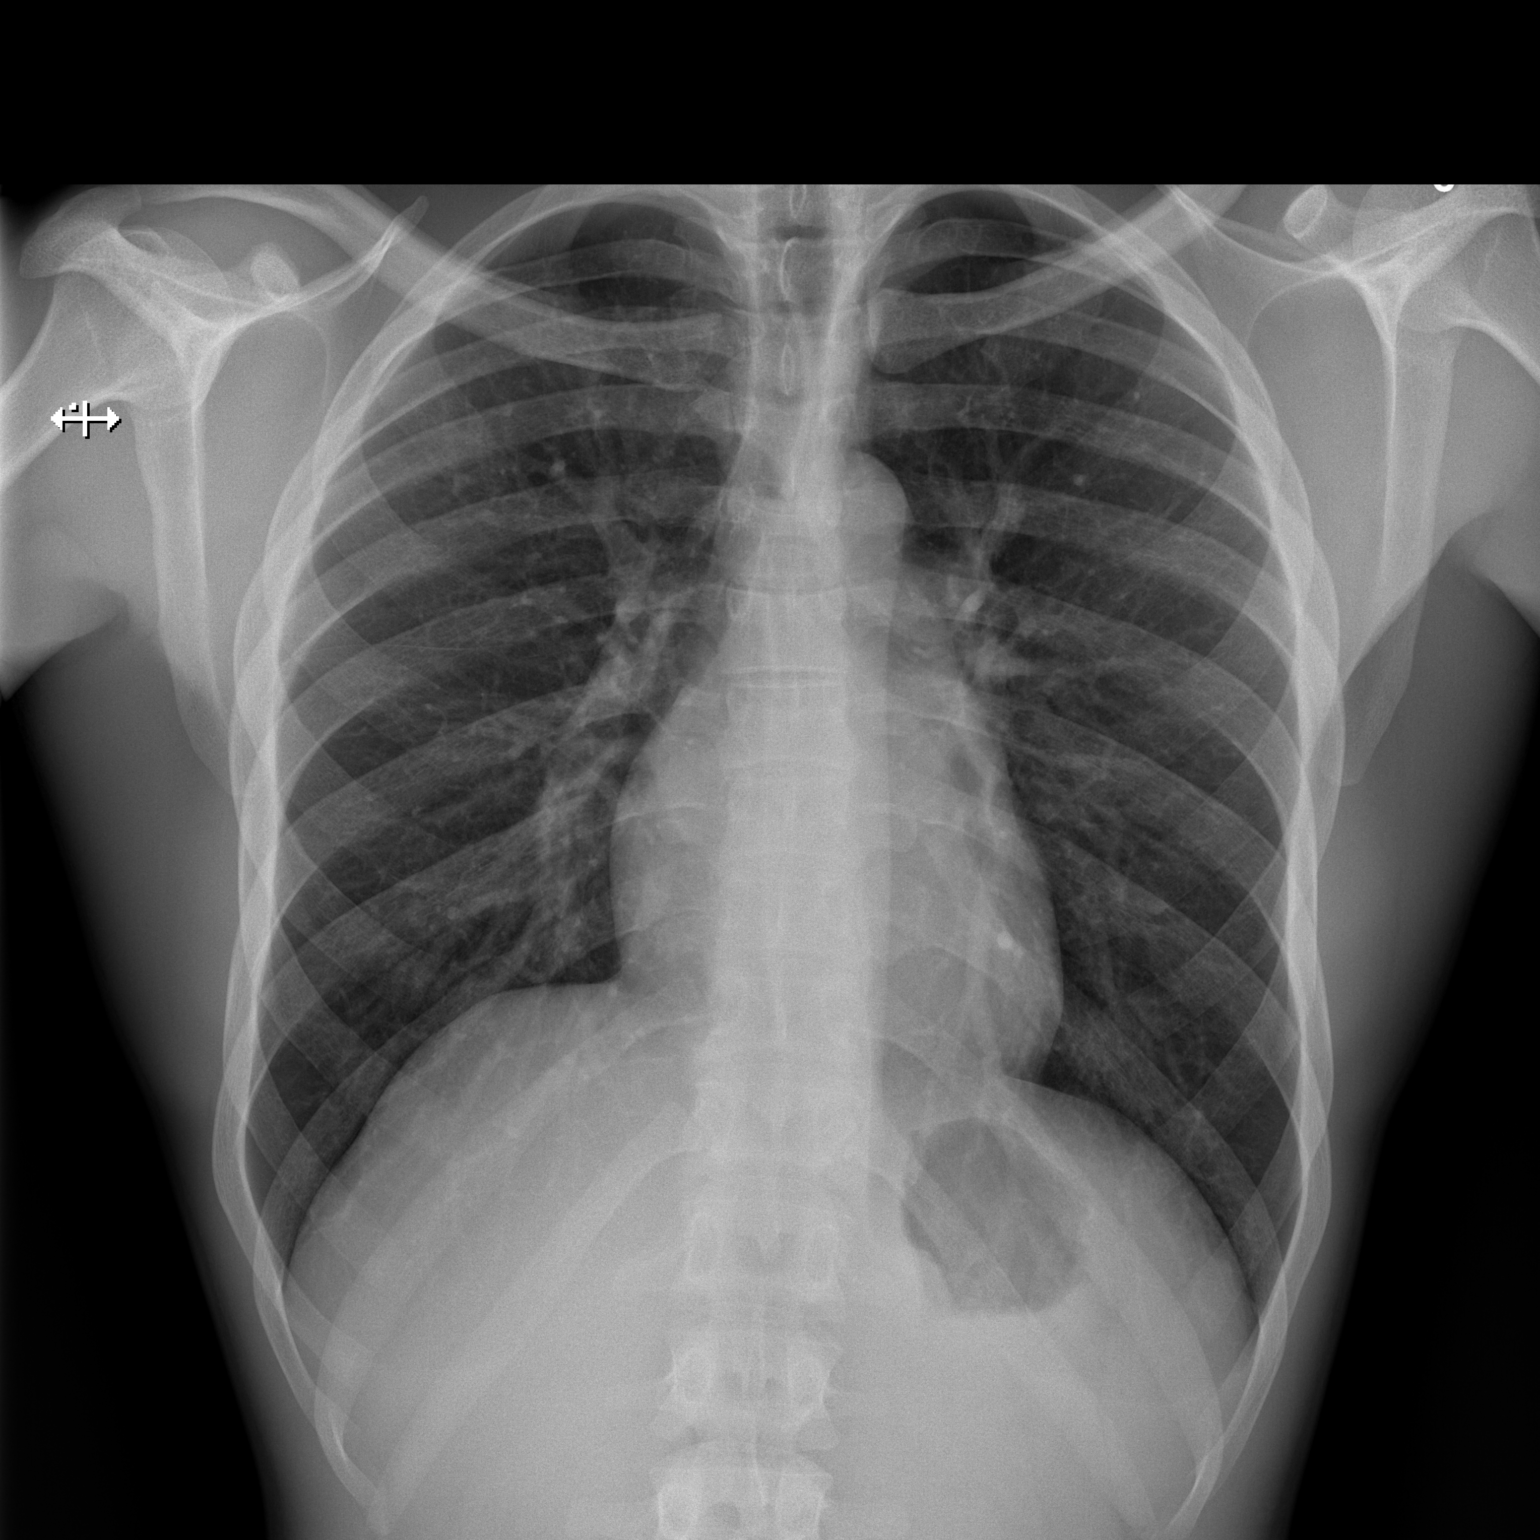

[w chest lat]
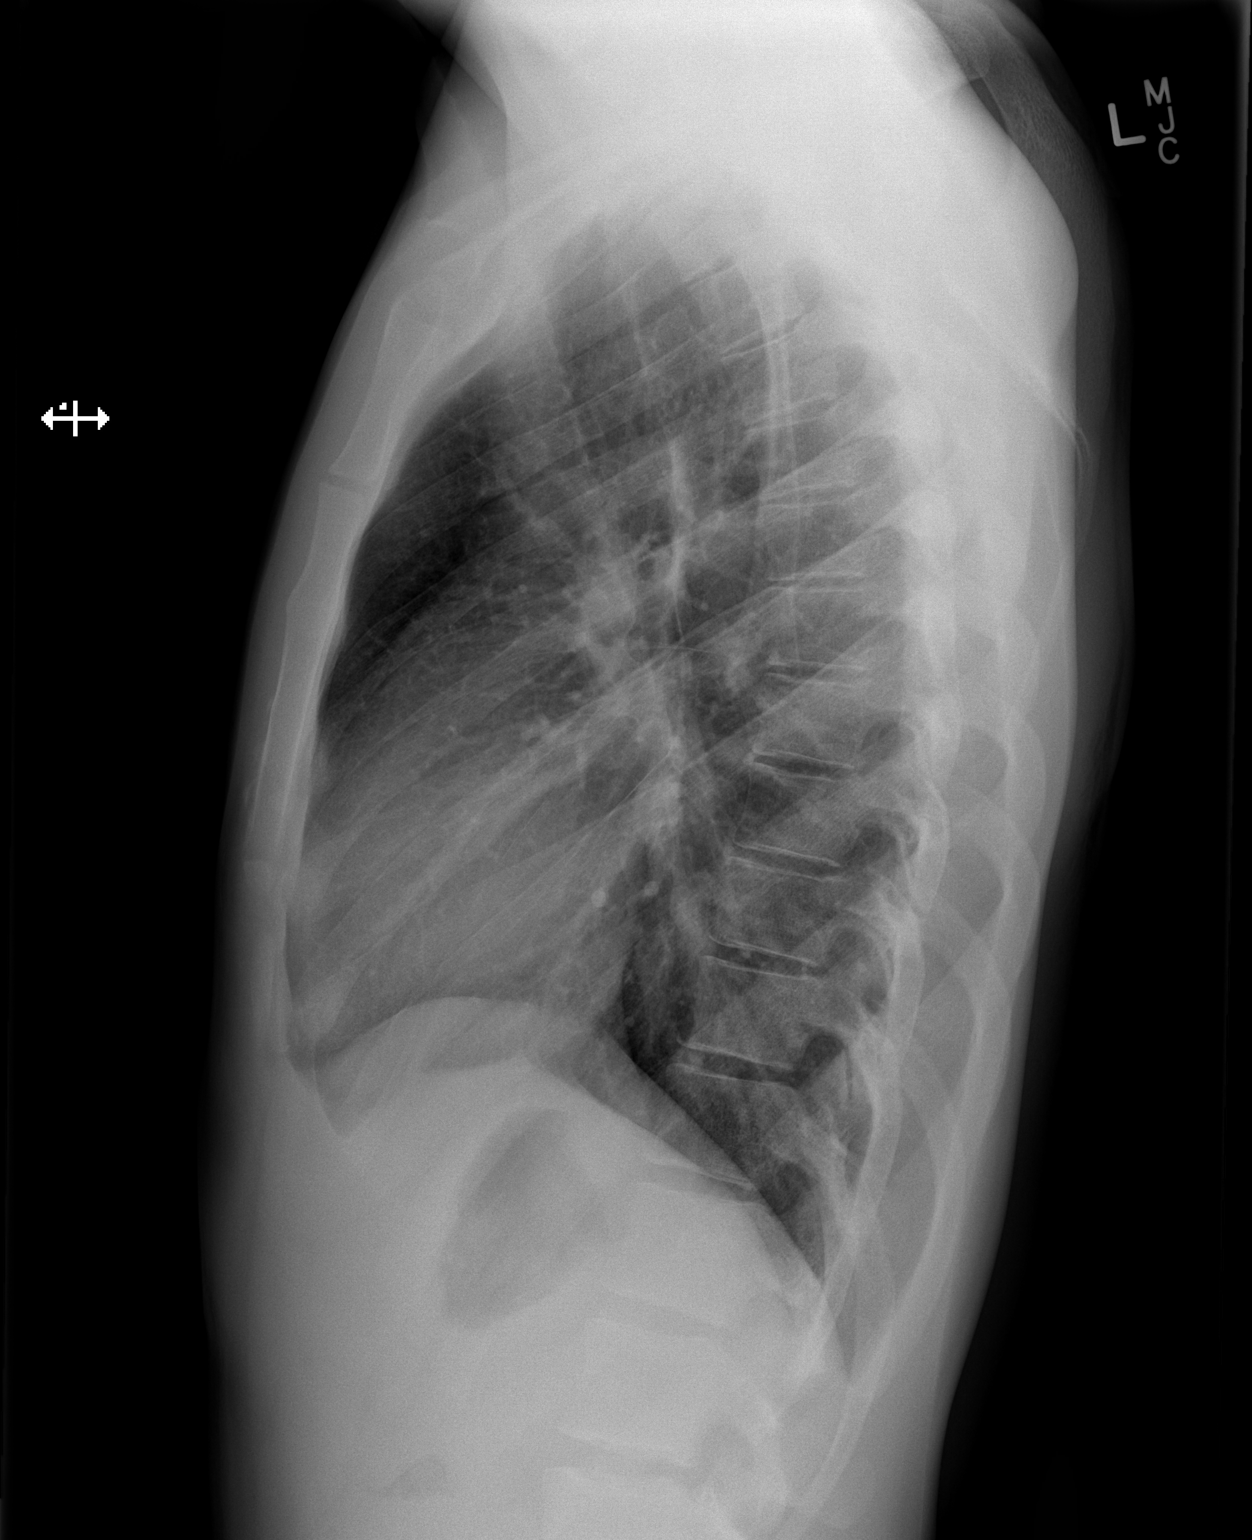

[2 of 2 positions shown; findings below may reference images not displayed]

FINDINGS: The lungs are mildly hyperinflated without hemidiaphragm flattening.
There is no focal infiltrate. Stable nodules measuring up to 3 mm in
diameter are present bilaterally consistent with previous
granulomatous infection. The cardiopericardial silhouette is normal
in size. The pulmonary vascularity is not engorged. The mediastinum
is normal in width. There is no pleural effusion or pneumothorax or
pneumomediastinum. The observed portions of the bony thorax exhibit
no acute abnormalities.
IMPRESSION: There is mild hyperinflation which may reflect the patient's
reactive airway disease. There is no evidence of acute pneumonia nor
CHF.
